# Patient Record
Sex: Male | Born: 1960 | Race: White | Hispanic: No | Marital: Married | Smoking: Former smoker
Health system: Southern US, Community
[De-identification: ages and names within clinical notes are randomized; demographics above are authoritative.]

## PROBLEM LIST (undated history)

## (undated) DIAGNOSIS — E119 Type 2 diabetes mellitus without complications: Secondary | ICD-10-CM

## (undated) DIAGNOSIS — I10 Essential (primary) hypertension: Secondary | ICD-10-CM

## (undated) HISTORY — PX: CORONARY ANGIOPLASTY WITH STENT PLACEMENT: SHX49

---

## 2018-06-27 ENCOUNTER — Encounter: Payer: Self-pay | Admitting: Emergency Medicine

## 2018-06-27 ENCOUNTER — Emergency Department
Admission: EM | Admit: 2018-06-27 | Discharge: 2018-06-27 | Disposition: A | Discharge status: Home / Self Care | Payer: BLUE CROSS/BLUE SHIELD | Attending: Emergency Medicine | Admitting: Emergency Medicine

## 2018-06-27 ENCOUNTER — Other Ambulatory Visit: Payer: Self-pay

## 2018-06-27 ENCOUNTER — Emergency Department: Payer: BLUE CROSS/BLUE SHIELD

## 2018-06-27 DIAGNOSIS — R0602 Shortness of breath: Secondary | ICD-10-CM | POA: Insufficient documentation

## 2018-06-27 DIAGNOSIS — Z955 Presence of coronary angioplasty implant and graft: Secondary | ICD-10-CM | POA: Insufficient documentation

## 2018-06-27 DIAGNOSIS — R079 Chest pain, unspecified: Secondary | ICD-10-CM | POA: Diagnosis present

## 2018-06-27 DIAGNOSIS — E119 Type 2 diabetes mellitus without complications: Secondary | ICD-10-CM | POA: Insufficient documentation

## 2018-06-27 DIAGNOSIS — Z87891 Personal history of nicotine dependence: Secondary | ICD-10-CM | POA: Insufficient documentation

## 2018-06-27 DIAGNOSIS — I10 Essential (primary) hypertension: Secondary | ICD-10-CM | POA: Diagnosis not present

## 2018-06-27 HISTORY — DX: Type 2 diabetes mellitus without complications: E11.9

## 2018-06-27 HISTORY — DX: Essential (primary) hypertension: I10

## 2018-06-27 LAB — CBC
HCT: 41.4 % (ref 40.0–52.0)
Hemoglobin: 14.7 g/dL (ref 13.0–18.0)
MCH: 30.4 pg (ref 26.0–34.0)
MCHC: 35.6 g/dL (ref 32.0–36.0)
MCV: 85.6 fL (ref 80.0–100.0)
Platelets: 294 10*3/uL (ref 150–440)
RBC: 4.84 MIL/uL (ref 4.40–5.90)
RDW: 13.2 % (ref 11.5–14.5)
WBC: 10.3 10*3/uL (ref 3.8–10.6)

## 2018-06-27 LAB — COMPREHENSIVE METABOLIC PANEL
ALT: 25 U/L (ref 0–44)
ANION GAP: 9 (ref 5–15)
AST: 25 U/L (ref 15–41)
Albumin: 4.9 g/dL (ref 3.5–5.0)
Alkaline Phosphatase: 53 U/L (ref 38–126)
BUN: 31 mg/dL — AB (ref 6–20)
CO2: 28 mmol/L (ref 22–32)
CREATININE: 1.34 mg/dL — AB (ref 0.61–1.24)
Calcium: 9.9 mg/dL (ref 8.9–10.3)
Chloride: 100 mmol/L (ref 98–111)
GFR calc non Af Amer: 57 mL/min — ABNORMAL LOW (ref >60)
Glucose, Bld: 189 mg/dL — ABNORMAL HIGH (ref 70–99)
POTASSIUM: 3.5 mmol/L (ref 3.5–5.1)
SODIUM: 137 mmol/L (ref 135–145)
Total Bilirubin: 0.7 mg/dL (ref 0.3–1.2)
Total Protein: 8.1 g/dL (ref 6.5–8.1)

## 2018-06-27 LAB — TROPONIN I: Troponin I: <0.03 ng/mL (ref <0.03)

## 2018-06-27 MED ORDER — GI COCKTAIL ~~LOC~~
30.0000 mL | Freq: Once | ORAL | Status: AC
  Administered 2018-06-27: 30 mL via ORAL

## 2018-06-27 MED ORDER — NITROGLYCERIN 0.4 MG SL SUBL: 0.4000 mg | SUBLINGUAL_TABLET | SUBLINGUAL | 0 refills | Status: AC | PRN

## 2018-06-27 MED ORDER — NITROGLYCERIN 0.4 MG SL SUBL
0.4000 mg | SUBLINGUAL_TABLET | SUBLINGUAL | Status: DC | PRN
  Administered 2018-06-27: 0.4 mg via SUBLINGUAL

## 2018-06-27 MED ORDER — GI COCKTAIL ~~LOC~~
ORAL | Status: AC
  Filled 2018-06-27: qty 30

## 2018-06-27 MED ORDER — NITROGLYCERIN 0.4 MG SL SUBL
SUBLINGUAL_TABLET | SUBLINGUAL | Status: AC
  Filled 2018-06-27: qty 3

## 2018-06-27 NOTE — ED Provider Notes (Signed)
[facility] Emergency Department Provider Note   ____________________________________________   I have reviewed the triage vital signs and the nursing notes.   HISTORY  Chief Complaint Chest Pain   History limited by: Not Limited   HPI Philip Ballard is a 57 y.o. male who presents to the emergency department today with concerns for chest pain.  Is located in the center chest.  It started this morning.  It woke him from sleep.  He describes it as sharp.  It will become severe and then slowly and gradually eased off.  He did have some associated shortness of breath.  He has felt like he was having some difficulty with exertion that day.  Denies any diaphoresis.  Denies any recent travel.  States he had similar pain 2 months ago and was evaluated at [facility] in [place].  Apparently at that time he had a stress and a catheterization which he stated were negative.   Per medical record review patient has a history of DM, HTN.  Past Medical History:  Diagnosis Date  . Diabetes mellitus without complication (HCC)   . Hypertension     There are no active problems to display for this patient.   Past Surgical History:  Procedure Laterality Date  . CORONARY ANGIOPLASTY WITH STENT PLACEMENT      Prior to Admission medications   Not on File    Allergies Amoxicillin  No family history on file.  Social History Social History   Tobacco Use  . Smoking status: Former Smoker    Types: Cigarettes  Substance Use Topics  . Alcohol use: Not on file  . Drug use: Not on file    Review of Systems Constitutional: No fever/chills Eyes: No visual changes. ENT: No sore throat. Cardiovascular: Positive for chest pain. Respiratory: Positive for shortness of breath. Gastrointestinal: No abdominal pain.  No nausea, no vomiting.  No diarrhea.   Genitourinary: Negative for dysuria. Musculoskeletal: Negative for back pain. Skin: Negative for  rash. Neurological: Negative for headaches, focal weakness or numbness.  ____________________________________________   PHYSICAL EXAM:  VITAL SIGNS: ED Triage Vitals [06/27/18 1037]  Enc Vitals Group     BP (!) 167/86     Pulse Rate 71     Resp 18     Temp 97.8 F (36.6 C)     Temp Source Oral     SpO2 99 %     Weight 212 lb (96.2 kg)     Height 5\' 10"  (1.778 m)     Head Circumference      Peak Flow      Pain Score 0    Constitutional: Alert and oriented.  Eyes: Conjunctivae are normal.  ENT      Head: Normocephalic and atraumatic.      Nose: No congestion/rhinnorhea.      Mouth/Throat: Mucous membranes are moist.      Neck: No stridor. Hematological/Lymphatic/Immunilogical: No cervical lymphadenopathy. Cardiovascular: Normal rate, regular rhythm.  No murmurs, rubs, or gallops.  Respiratory: Normal respiratory effort without tachypnea nor retractions. Breath sounds are clear and equal bilaterally. No wheezes/rales/rhonchi. Gastrointestinal: Soft and non tender. No rebound. No guarding.  Genitourinary: Deferred Musculoskeletal: Normal range of motion in all extremities. No lower extremity edema. Neurologic:  Normal speech and language. No gross focal neurologic deficits are appreciated.  Skin:  Skin is warm, dry and intact. No rash noted. Psychiatric: Mood and affect are normal. Speech and behavior are normal. Patient exhibits appropriate insight and judgment.  ____________________________________________    LABS (pertinent positives/negatives)  CBC wbc 10.3, hgb 14.7, plt 294 Trop <0.03 x 2 CMP na 137, k 3.5, glu 189, cr 1.34  ____________________________________________   EKG  I, Phineas SemenGraydon Delphin Funes, attending physician, personally viewed and interpreted this EKG  EKG Time: 1033 Rate: 69 Rhythm: normal sinus rhythm Axis: right superior axis deviation Intervals: qtc 430 QRS: narrow ST changes: no st elevation Impression: abnormal  ekg   ____________________________________________    RADIOLOGY  CXR No acute disease  ____________________________________________   PROCEDURES  Procedures  ____________________________________________   INITIAL IMPRESSION / ASSESSMENT AND PLAN / ED COURSE  Pertinent labs & imaging results that were available during my care of the patient were reviewed by me and considered in my medical decision making (see chart for details).   Presented to the emergency department today because of concerns for chest pain.  2 sets of troponin were negative.  Patient recently underwent catheterization 2 months ago.  He did get some relief with a nitroglycerin.  At this point given negative troponins recent catheter do think is reasonable for patient be discharged to follow-up with cardiology.  Discussed importance of follow-up with patient.   ____________________________________________   FINAL CLINICAL IMPRESSION(S) / ED DIAGNOSES  Final diagnoses:  Nonspecific chest pain     Note: This dictation was prepared with Dragon dictation. Any transcriptional errors that result from this process are unintentional     Phineas SemenGoodman, Eiden Bagot, MD 06/27/18 1433

## 2018-06-27 NOTE — Discharge Instructions (Addendum)
Please seek medical attention for any high fevers, chest pain, shortness of breath, change in behavior, persistent vomiting, bloody stool or any other new or concerning symptoms.  

## 2018-06-27 NOTE — ED Triage Notes (Signed)
Mid chest pain began during night. Stopped about 9 am today. Accompanied by SOB and dizziness. Fatigued today.

## 2018-06-27 NOTE — ED Notes (Signed)
Patient transported to X-ray 

## 2019-08-04 ENCOUNTER — Other Ambulatory Visit: Payer: Self-pay

## 2019-08-04 ENCOUNTER — Emergency Department: Payer: BC Managed Care – PPO

## 2019-08-04 ENCOUNTER — Emergency Department
Admission: EM | Admit: 2019-08-04 | Discharge: 2019-08-04 | Disposition: A | Discharge status: Home / Self Care | Payer: BC Managed Care – PPO | Attending: Student | Admitting: Student

## 2019-08-04 DIAGNOSIS — Y92818 Other transport vehicle as the place of occurrence of the external cause: Secondary | ICD-10-CM | POA: Diagnosis not present

## 2019-08-04 DIAGNOSIS — M79672 Pain in left foot: Secondary | ICD-10-CM | POA: Insufficient documentation

## 2019-08-04 DIAGNOSIS — Y999 Unspecified external cause status: Secondary | ICD-10-CM | POA: Insufficient documentation

## 2019-08-04 DIAGNOSIS — I10 Essential (primary) hypertension: Secondary | ICD-10-CM | POA: Diagnosis not present

## 2019-08-04 DIAGNOSIS — M25562 Pain in left knee: Secondary | ICD-10-CM | POA: Diagnosis present

## 2019-08-04 DIAGNOSIS — Z87891 Personal history of nicotine dependence: Secondary | ICD-10-CM | POA: Insufficient documentation

## 2019-08-04 DIAGNOSIS — Z7984 Long term (current) use of oral hypoglycemic drugs: Secondary | ICD-10-CM | POA: Diagnosis not present

## 2019-08-04 DIAGNOSIS — Z79899 Other long term (current) drug therapy: Secondary | ICD-10-CM | POA: Insufficient documentation

## 2019-08-04 DIAGNOSIS — E119 Type 2 diabetes mellitus without complications: Secondary | ICD-10-CM | POA: Diagnosis not present

## 2019-08-04 DIAGNOSIS — W010XXA Fall on same level from slipping, tripping and stumbling without subsequent striking against object, initial encounter: Secondary | ICD-10-CM | POA: Diagnosis not present

## 2019-08-04 DIAGNOSIS — Y939 Activity, unspecified: Secondary | ICD-10-CM | POA: Diagnosis not present

## 2019-08-04 MED ORDER — HYDROCODONE-ACETAMINOPHEN 5-325 MG PO TABS: 1.0000 | ORAL_TABLET | Freq: Four times a day (QID) | ORAL | 0 refills | Status: AC | PRN

## 2019-08-04 NOTE — Discharge Instructions (Signed)
Call make an appointment with Dr. Harlow Mares who is the orthopedist on call.  His contact information and address are written on your discharge papers.  Use ice and elevation to your knee and foot as needed for pain and swelling.  You may take ibuprofen at home for inflammation.  A prescription for Norco which is a pain medication was sent to your pharmacy.  Use your crutches support your weight when walking.  Your knee should be evaluated by Dr. Harlow Mares if not improving or having any pain.

## 2019-08-04 NOTE — ED Provider Notes (Signed)
[facility] Emergency Department Provider Note   ____________________________________________   First MD Initiated Contact with Patient 08/04/19 [phone]     (approximate)  I have reviewed the triage vital signs and the nursing notes.   HISTORY  Chief Complaint Knee Pain   HPI Philip Ballard is a 58 y.o. male presents to the ED with complaint of left foot pain and knee pain after falling out of his RV yesterday.  Patient states that he tripped.  He denies any head injury or loss of consciousness.  He states that pain was increased this morning with weightbearing.  Patient states that he has had arthroscopic surgery on his left knee in the past.  Currently he denies any pain.      Past Medical History:  Diagnosis Date  . Diabetes mellitus without complication ([place])   . Hypertension     There are no active problems to display for this patient.   Past Surgical History:  Procedure Laterality Date  . CORONARY ANGIOPLASTY WITH STENT PLACEMENT      Prior to Admission medications   Medication Sig Start Date End Date Taking? Authorizing Provider  amLODipine (NORVASC) 10 MG tablet Take 10 mg by mouth at bedtime.     [provider]  diclofenac (VOLTAREN) 50 MG EC tablet Take 50 mg by mouth daily.    [provider]  fluticasone (FLONASE) 50 MCG/ACT nasal spray Place 1 spray into both nostrils daily.    [provider]  gabapentin (NEURONTIN) 100 MG capsule Take 100 mg by mouth at bedtime.    [provider]  glyBURIDE (DIABETA) 2.5 MG tablet Take 2.5 mg by mouth 2 (two) times daily with a meal.    [provider]  hydrochlorothiazide (HYDRODIURIL) 25 MG tablet Take 25 mg by mouth daily.    [provider]  HYDROcodone-acetaminophen (NORCO/VICODIN) 5-325 MG tablet Take 1 tablet by mouth every 6 (six) hours as needed for moderate pain. 08/04/19   Johnn Hai, PA-C  isosorbide mononitrate (IMDUR) 30 MG 24  hr tablet Take 30 mg by mouth daily.    [provider]  lisinopril (PRINIVIL,ZESTRIL) 40 MG tablet Take 40 mg by mouth daily.    [provider]  meloxicam (MOBIC) 7.5 MG tablet Take 7.5 mg by mouth daily.    [provider]  metFORMIN (GLUCOPHAGE) 1000 MG tablet Take 1,000 mg by mouth 2 (two) times daily with a meal.    [provider]  nitroGLYCERIN (NITROSTAT) 0.4 MG SL tablet Place 1 tablet (0.4 mg total) under the tongue every 5 (five) minutes as needed for chest pain. 06/27/18 06/27/19  Nance Pear, MD  omeprazole (PRILOSEC) 20 MG capsule Take 20 mg by mouth daily.    [provider]  sertraline (ZOLOFT) 100 MG tablet Take 100 mg by mouth daily.    [provider]  simvastatin (ZOCOR) 20 MG tablet Take 20 mg by mouth at bedtime.    [provider]  triamterene-hydrochlorothiazide (MAXZIDE-25) 37.5-25 MG tablet Take 1 tablet by mouth daily.    [provider]    Allergies Other and Amoxicillin  No family history on file.  Social History Social History   Tobacco Use  . Smoking status: Former Smoker    Types: Cigarettes  . Smokeless tobacco: Never Used  Substance Use Topics  . Alcohol use: Yes  . Drug use: Not Currently    Review of Systems Constitutional: No fever/chills Cardiovascular: Denies chest pain. Respiratory: Denies  shortness of breath. Musculoskeletal: Positive for left knee and foot pain. Skin: Negative for rash. Neurological: Negative for headaches, focal weakness or numbness. ____________________________________________   PHYSICAL EXAM:  VITAL SIGNS: ED Triage Vitals  Enc Vitals Group     BP 08/04/19 0933 (!) 152/72     Pulse Rate 08/04/19 0929 79     Resp 08/04/19 0929 16     Temp 08/04/19 0929 98.5 F (36.9 C)     Temp Source 08/04/19 0929 Oral     SpO2 08/04/19 0929 98 %     Weight 08/04/19 0930 217 lb (98.4 kg)     Height 08/04/19 0930 5\' 10"  (1.778 m)     Head  Circumference --      Peak Flow --      Pain Score 08/04/19 0930 0     Pain Loc --      Pain Edu? --      Excl. in GC? --    Constitutional: Alert and oriented. Well appearing and in no acute distress. Eyes: Conjunctivae are normal.  Head: Atraumatic. Neck: No stridor.  Nontender cervical spine to palpation posteriorly. Cardiovascular: Normal rate, regular rhythm. Grossly normal heart sounds.  Good peripheral circulation. Respiratory: Normal respiratory effort.  No retractions. Lungs CTAB. Musculoskeletal: On examination of the left knee there is moderate amount of soft tissue edema present.  There is generalized tenderness to palpation but no point tenderness on the patella.  No gross deformity is present.  Range of motion is restricted secondary to discomfort.  Skin is intact.  On examination of the left foot there is no gross deformity and minimal soft tissue edema present.  Is markedly tender on palpation of the fourth and fifth distal metatarsal.  Motor sensory function intact to the digits.  Skin is intact. Neurologic:  Normal speech and language. No gross focal neurologic deficits are appreciated. No gait instability. Skin:  Skin is warm, dry and intact.  Psychiatric: Mood and affect are normal. Speech and behavior are normal.  ____________________________________________   LABS (all labs ordered are listed, but only abnormal results are displayed)  Labs Reviewed - No data to display  RADIOLOGY  Official radiology report(s): Dg Knee Complete 4 Views Left  Result Date: 08/04/2019 CLINICAL DATA:  Pain post fall. EXAM: LEFT KNEE - COMPLETE 4+ VIEW COMPARISON:  None. FINDINGS: No displaced fractures seen radiographically. However there is a high density suprapatellar joint effusion. 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 Mild soft tissue swelling. IMPRESSION: 1. Suprapatellar joint effusion, which in the settings of acute trauma likely represents  radio occult fracture or internal derangement of the knee. No fracture is seen radiographically. 2. 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 Electronically Signed   By: Ted Mcalpineobrinka  Dimitrova M.D.   On: 08/04/2019 10:38   Dg Foot Complete Left  Result Date: 08/04/2019 CLINICAL DATA:  Status post fall with pain. EXAM: LEFT FOOT - COMPLETE 3+ VIEW COMPARISON:  None. FINDINGS: There is no evidence of fracture or dislocation. There is no evidence of arthropathy. Superior calcaneal spur noted. Soft tissues are unremarkable. IMPRESSION: No acute osseous abnormality identified. Electronically Signed   By: Ted Mcalpineobrinka  Dimitrova M.D.   On: 08/04/2019 10:39    ____________________________________________   PROCEDURES  Procedure(s) performed (including Critical Care):  Procedures Knee immobilizer and crutches were done by the RN.  ____________________________________________   INITIAL IMPRESSION / ASSESSMENT AND PLAN / ED COURSE  As part of my  medical decision making, I reviewed the following data within the electronic MEDICAL RECORD NUMBER Notes from prior ED visits and [place] Controlled Substance Database 58 year old male presents to the ED with complaint of left knee and foot pain after falling out of his RV yesterday.  This was a mechanical fall and not a syncopal episode.  X-rays for the foot were negative.  The x-rays on the left knee however shows effusion and possible suspicion for an occult fracture to the patella or internal derangement.  Patient is aware that he should follow-up with the orthopedist listed on his discharge papers.  He was taken out of work and encouraged to ice and elevate.  Crutches are to be used when he is walking.  ____________________________________________   FINAL CLINICAL IMPRESSION(S) / ED DIAGNOSES  Final diagnoses:  Acute pain of left knee  Acute foot pain, left     ED Discharge Orders         Ordered    HYDROcodone-acetaminophen  (NORCO/VICODIN) 5-325 MG tablet  Every 6 hours PRN     08/04/19 1110           Note:  This document was prepared using Dragon voice recognition software and may include unintentional dictation errors.    Tommi Rumps, PA-C 08/04/19 1401    Miguel Aschoff., MD 08/05/19 1620

## 2019-08-04 NOTE — ED Triage Notes (Signed)
Pt states he tripped and fell out of his RV yesterday and injured his left knee and foot.

## 2021-02-16 IMAGING — DX DG KNEE COMPLETE 4+V*L*
4 series · 4 of 4 positions shown · non-contrast
Comparison: None.

CLINICAL DATA: Pain post fall.

EXAM:
LEFT KNEE - COMPLETE 4+ VIEW

[knee obl (1 of 2)]
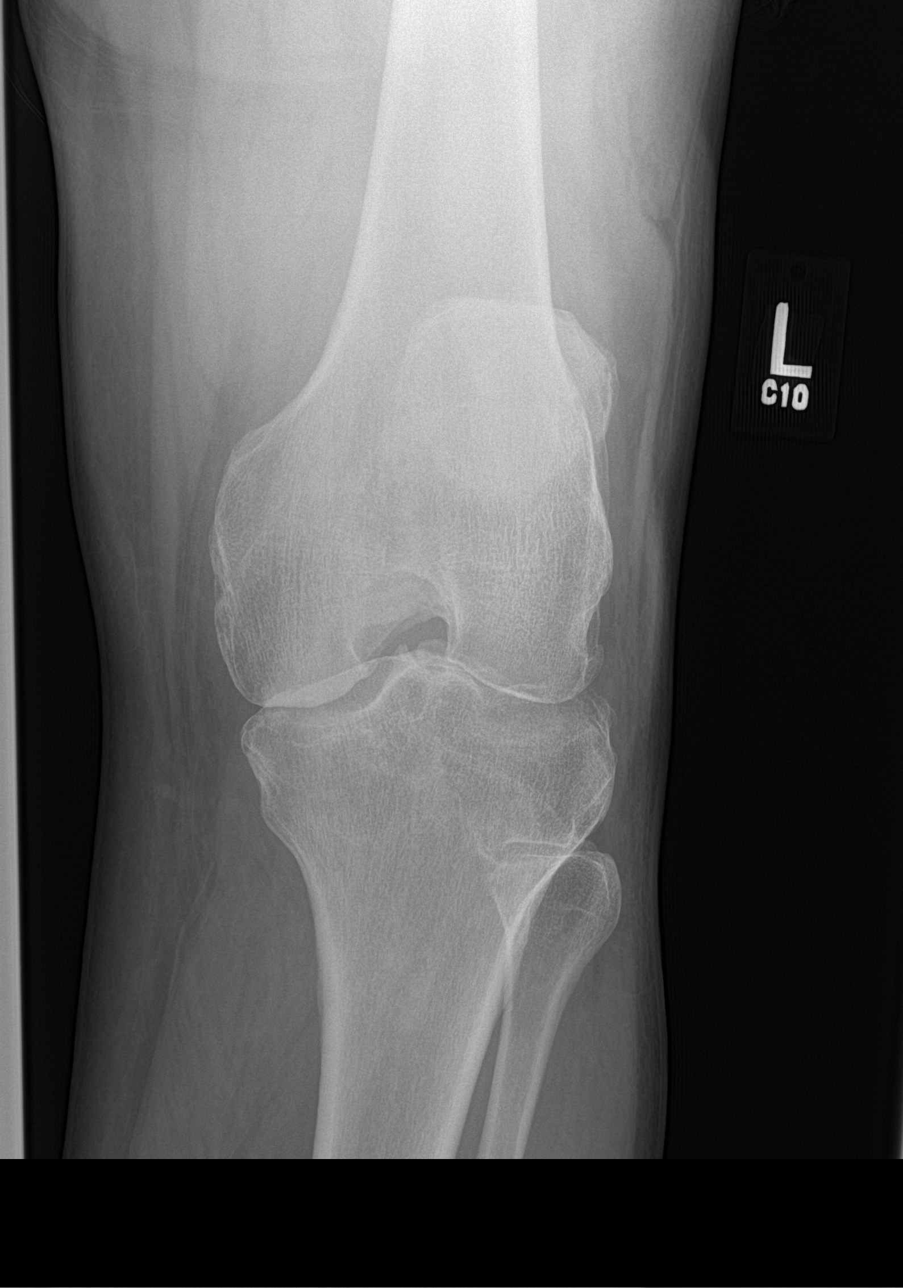

[knee lat]
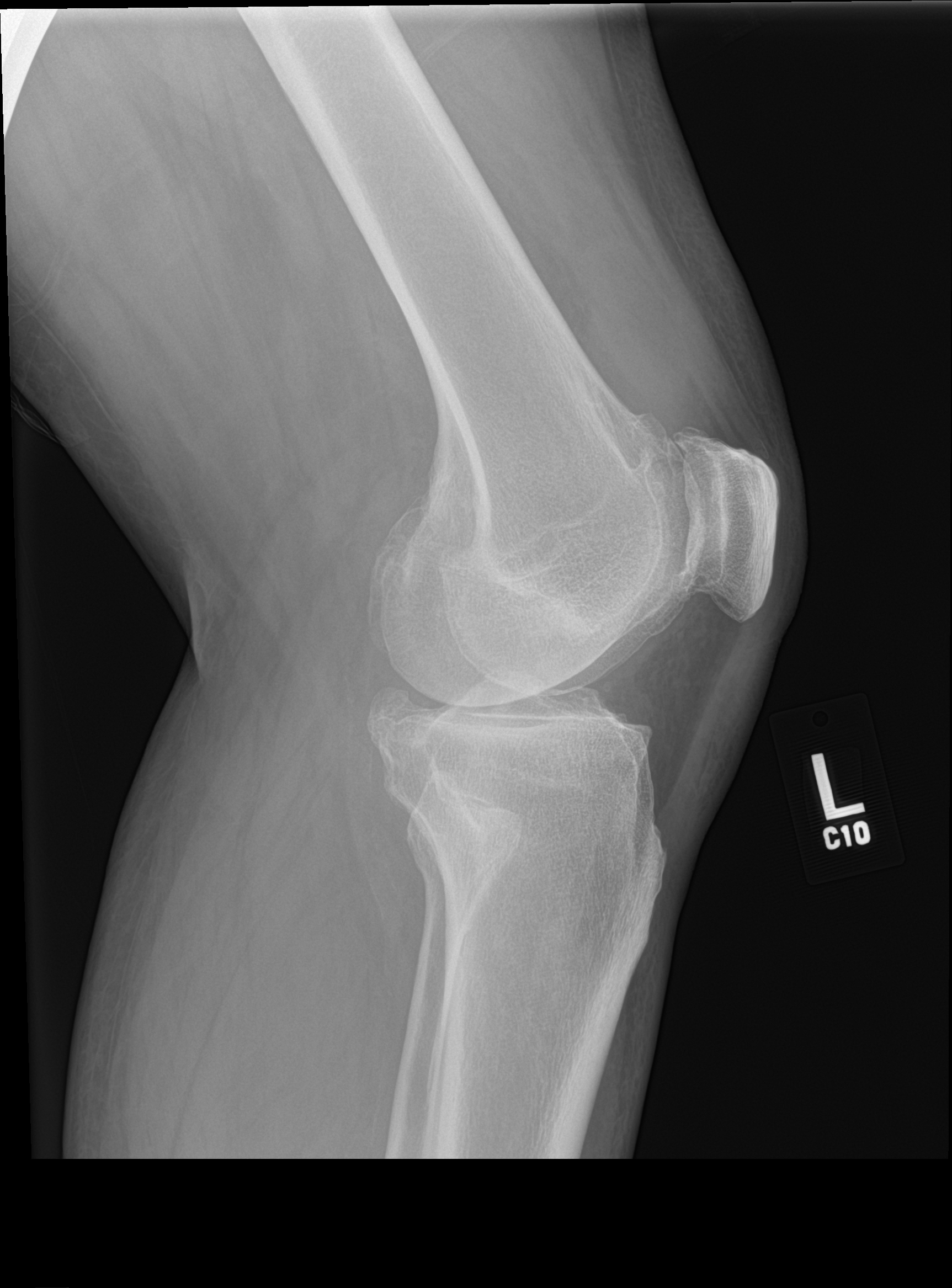

[knee ap]
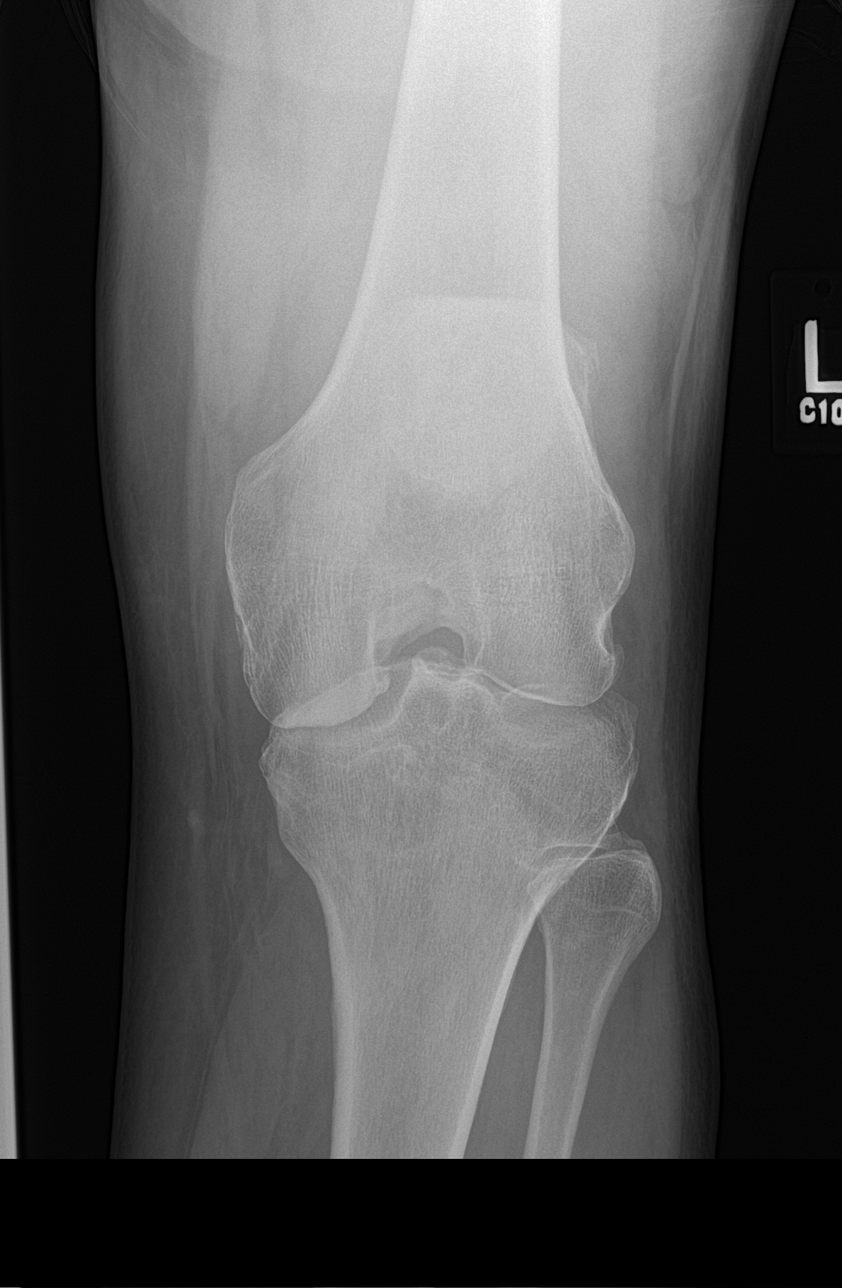

[knee obl (2 of 2)]
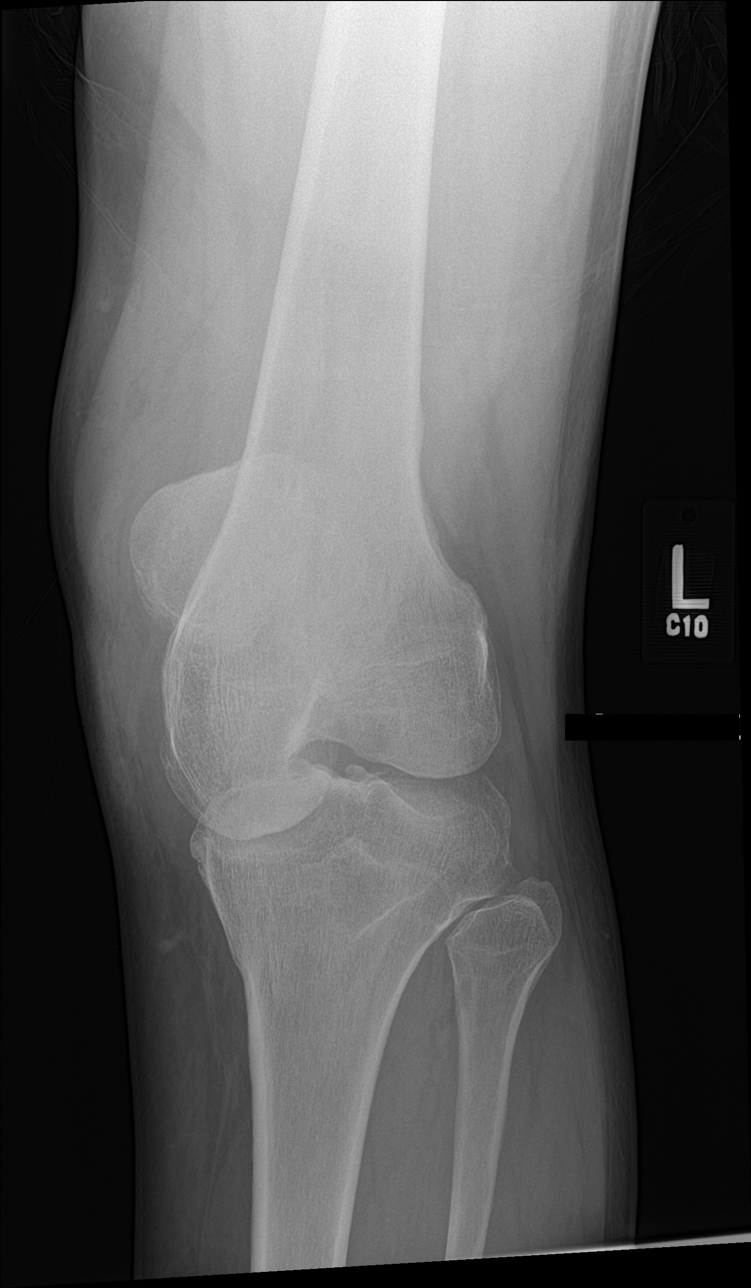

[4 of 4 positions shown; findings below may reference images not displayed]

FINDINGS: No displaced fractures seen radiographically. However there is a
high density suprapatellar joint effusion.

Three compartment mild to moderate osteoarthritic changes of the
left knee.

Mild soft tissue swelling.
IMPRESSION: 1. Suprapatellar joint effusion, which in the settings of acute
trauma likely represents radio occult fracture or internal
derangement of the knee. No fracture is seen radiographically.
2. Three compartment mild to moderate osteoarthritic changes of the
left knee.
